# Patient Record
Sex: Female | Born: 1975 | Race: Black or African American | Hispanic: No | Smoking: Former smoker
Health system: Southern US, Community
[De-identification: ages and names within clinical notes are randomized; demographics above are authoritative.]

## PROBLEM LIST (undated history)

## (undated) DIAGNOSIS — E282 Polycystic ovarian syndrome: Secondary | ICD-10-CM

## (undated) DIAGNOSIS — F329 Major depressive disorder, single episode, unspecified: Secondary | ICD-10-CM

## (undated) DIAGNOSIS — I10 Essential (primary) hypertension: Secondary | ICD-10-CM

## (undated) DIAGNOSIS — F32A Depression, unspecified: Secondary | ICD-10-CM

## (undated) DIAGNOSIS — G43909 Migraine, unspecified, not intractable, without status migrainosus: Secondary | ICD-10-CM

## (undated) DIAGNOSIS — E559 Vitamin D deficiency, unspecified: Secondary | ICD-10-CM

## (undated) DIAGNOSIS — R7611 Nonspecific reaction to tuberculin skin test without active tuberculosis: Secondary | ICD-10-CM

## (undated) DIAGNOSIS — R011 Cardiac murmur, unspecified: Secondary | ICD-10-CM

## (undated) DIAGNOSIS — B019 Varicella without complication: Secondary | ICD-10-CM

## (undated) DIAGNOSIS — K5792 Diverticulitis of intestine, part unspecified, without perforation or abscess without bleeding: Secondary | ICD-10-CM

## (undated) HISTORY — DX: Varicella without complication: B01.9

## (undated) HISTORY — DX: Nonspecific reaction to tuberculin skin test without active tuberculosis: R76.11

## (undated) HISTORY — DX: Major depressive disorder, single episode, unspecified: F32.9

## (undated) HISTORY — DX: Vitamin D deficiency, unspecified: E55.9

## (undated) HISTORY — DX: Migraine, unspecified, not intractable, without status migrainosus: G43.909

## (undated) HISTORY — DX: Cardiac murmur, unspecified: R01.1

## (undated) HISTORY — PX: COLONOSCOPY: SHX174

## (undated) HISTORY — DX: Depression, unspecified: F32.A

## (undated) HISTORY — DX: Diverticulitis of intestine, part unspecified, without perforation or abscess without bleeding: K57.92

---

## 1997-12-09 ENCOUNTER — Emergency Department (HOSPITAL_COMMUNITY): Admission: EM | Admit: 1997-12-09 | Discharge: 1997-12-09 | Payer: Self-pay | Admitting: Emergency Medicine

## 1998-08-23 ENCOUNTER — Emergency Department (HOSPITAL_COMMUNITY): Admission: EM | Admit: 1998-08-23 | Discharge: 1998-08-24 | Payer: Self-pay

## 1998-08-24 ENCOUNTER — Ambulatory Visit (HOSPITAL_COMMUNITY): Admission: RE | Admit: 1998-08-24 | Discharge: 1998-08-24 | Payer: Self-pay | Admitting: Emergency Medicine

## 1998-08-24 ENCOUNTER — Encounter: Payer: Self-pay | Admitting: Emergency Medicine

## 2003-04-05 ENCOUNTER — Emergency Department (HOSPITAL_COMMUNITY): Admission: EM | Admit: 2003-04-05 | Discharge: 2003-04-05 | Payer: Self-pay | Admitting: Emergency Medicine

## 2007-06-01 ENCOUNTER — Emergency Department (HOSPITAL_COMMUNITY): Admission: EM | Admit: 2007-06-01 | Discharge: 2007-06-01 | Payer: Self-pay | Admitting: Family Medicine

## 2007-11-09 ENCOUNTER — Ambulatory Visit: Payer: Self-pay | Admitting: Internal Medicine

## 2007-11-19 ENCOUNTER — Ambulatory Visit: Payer: Self-pay | Admitting: Internal Medicine

## 2008-09-04 ENCOUNTER — Ambulatory Visit: Payer: Self-pay | Admitting: *Deleted

## 2008-09-04 ENCOUNTER — Encounter (INDEPENDENT_AMBULATORY_CARE_PROVIDER_SITE_OTHER): Payer: Self-pay | Admitting: Internal Medicine

## 2008-09-04 ENCOUNTER — Inpatient Hospital Stay (HOSPITAL_COMMUNITY): Admission: EM | Admit: 2008-09-04 | Discharge: 2008-09-06 | Payer: Self-pay | Admitting: Emergency Medicine

## 2010-05-16 LAB — COMPREHENSIVE METABOLIC PANEL
AST: 18 U/L (ref 0–37)
CO2: 27 mEq/L (ref 19–32)
Calcium: 8.6 mg/dL (ref 8.4–10.5)
Creatinine, Ser: 0.84 mg/dL (ref 0.4–1.2)
GFR calc Af Amer: >60 mL/min (ref >60)
GFR calc non Af Amer: >60 mL/min (ref >60)

## 2010-05-16 LAB — CBC
HCT: 36.2 % (ref 36.0–46.0)
Hemoglobin: 11.9 g/dL — ABNORMAL LOW (ref 12.0–15.0)
MCHC: 32.9 g/dL (ref 30.0–36.0)
MCHC: 33.3 g/dL (ref 30.0–36.0)
MCV: 74.3 fL — ABNORMAL LOW (ref 78.0–100.0)
MCV: 74.7 fL — ABNORMAL LOW (ref 78.0–100.0)
Platelets: 284 10*3/uL (ref 150–400)
Platelets: 324 10*3/uL (ref 150–400)
RBC: 4.85 MIL/uL (ref 3.87–5.11)
RDW: 17.3 % — ABNORMAL HIGH (ref 11.5–15.5)
RDW: 17.6 % — ABNORMAL HIGH (ref 11.5–15.5)
WBC: 13.1 10*3/uL — ABNORMAL HIGH (ref 4.0–10.5)

## 2010-05-16 LAB — URINALYSIS, ROUTINE W REFLEX MICROSCOPIC
Ketones, ur: NEGATIVE mg/dL
Nitrite: NEGATIVE
Protein, ur: NEGATIVE mg/dL

## 2010-05-16 LAB — DIFFERENTIAL
Basophils Relative: 0 % (ref 0–1)
Eosinophils Absolute: 0 10*3/uL (ref 0.0–0.7)
Lymphs Abs: 0.3 10*3/uL — ABNORMAL LOW (ref 0.7–4.0)
Neutro Abs: 12.4 10*3/uL — ABNORMAL HIGH (ref 1.7–7.7)
Neutrophils Relative %: 94 % — ABNORMAL HIGH (ref 43–77)

## 2010-05-16 LAB — BASIC METABOLIC PANEL
BUN: 7 mg/dL (ref 6–23)
CO2: 28 mEq/L (ref 19–32)
Calcium: 8.6 mg/dL (ref 8.4–10.5)
Chloride: 106 mEq/L (ref 96–112)
Creatinine, Ser: 0.85 mg/dL (ref 0.4–1.2)
GFR calc Af Amer: >60 mL/min (ref >60)
GFR calc non Af Amer: >60 mL/min (ref >60)
Glucose, Bld: 97 mg/dL (ref 70–99)
Potassium: 3.6 mEq/L (ref 3.5–5.1)
Sodium: 139 mEq/L (ref 135–145)

## 2010-05-16 LAB — CULTURE, BLOOD (ROUTINE X 2): Culture: NO GROWTH

## 2010-05-16 LAB — CK: Total CK: 66 U/L (ref 7–177)

## 2010-05-16 LAB — SEDIMENTATION RATE: Sed Rate: 5 mm/hr (ref 0–22)

## 2010-06-22 NOTE — Discharge Summary (Signed)
Chloe [name], [name]             ACCOUNT NO.:  [id number]   MEDICAL RECORD NO.:  [id number]          PATIENT TYPE:  INP   LOCATION:  1519                         FACILITY:  [facility]   PHYSICIAN:  Ladell Pier, M.D.   DATE OF BIRTH:  Dec 28, 1975   DATE OF ADMISSION:  09/03/2008  DATE OF DISCHARGE:  09/06/2008                               DISCHARGE SUMMARY   CHIEF COMPLAINT:  Swelling and pain in the right lower extremity.   DISCHARGE DIAGNOSES:  1. Cellulitis in the right groin and right lower extremity.  2. Tobacco use.  3. Obesity.  4. Polycystic ovary disease.   DISCHARGE MEDICATIONS:  Bactrim double strength two p.o. b.i.d. x10  days.   FOLLOWUP APPOINTMENTS:  The patient is to follow up with primary care  doctor, Dr. Mercy [name], in 1 week.   PROCEDURE:  None.   CONSULTATIONS:  None.   HISTORY OF PRESENT ILLNESS:  The patient is a 35 year old African  American female that presented to the emergency room with severe pain in  the right extremity.  She had no history of tick bite or bug bite or  superficial wound.  No blunt trauma or irritation to the area.  She  reports that the pain is in the inguinal area.  It shoots down her right  leg.  Please see the admission note for remainder of history.   Past medical history, family history, social history, meds, allergies  and review of systems per admission H and P.   PHYSICAL EXAMINATION ON DISCHARGE:  VITAL SIGNS:  Temperature 98.4,  pulse 69, respirations 22, blood pressure 133/84, pulse ox 100% on room  air.  GENERAL:  The patient is lying on the bed and does not seem to be in any  acute distress.  HEENT:  Head is normocephalic, atraumatic.  Pupils reactive to light.  Throat without erythema.  CARDIOVASCULAR:  Regular rate and rhythm.  LUNGS:  Clear bilaterally.  ABDOMEN:  Positive bowel sounds.  EXTREMITIES:  Her right lower extremity has improved erythema with about  1+ pitting edema and 2+ DP pulse.   HOSPITAL  COURSE:  Right lower extremity swelling, pain, tenderness and  redness from the groin area going all the way down to the foot.  The  patient was admitted to the hospital.  It was presumed that it could be  possibly either infection or deep venous thrombosis.  She was started on  Lovenox and IV antibiotics, Rocephin and vancomycin.  She had Dopplers  done of her lower extremity that were negative for deep venous  thrombosis.  She was continued on the vancomycin and Rocephin and her  legs were improved significantly without any pain in the area.  She will  be discharged on Bactrim double strength to take 2 twice a day for 10  days.  She will follow up with her primary care doctor within a week.   DISCHARGE LABS:  Blood culture negative x2.  Sodium 138, potassium 3.5,  chloride 105, CO2 27, glucose 84, BUN 6, creatinine 0.84 AST 18, ALT 29,  WBC 15.1, hemoglobin 10.6, platelets 286, CK of 66,  ESR of 5.   CT scan showed a right groin cellulitis with associated inguinal  adenitis, nonspecific enlargement of both ovaries and this could be due  to polycystic ovary disease.      Ladell Pier, M.D.  Electronically Signed     NJ/MEDQ  D:  09/06/2008  T:  09/06/2008  Job:  [id number]   cc:   Dr Chloe [name]

## 2010-06-22 NOTE — H&P (Signed)
[name], [name]             ACCOUNT NO.:  [id number]   MEDICAL RECORD NO.:  [id number]          PATIENT TYPE:  EMS   LOCATION:  ED                           FACILITY:  [facility]   PHYSICIAN:  Lonia Blood, M.D.DATE OF BIRTH:  October 16, 1975   DATE OF ADMISSION:  09/03/2008  DATE OF DISCHARGE:                              HISTORY & PHYSICAL   PRIMARY CARE PHYSICIAN:  Gentry Fitz out of [place] area.   CHIEF COMPLAINT:  Severe pain in right groin and right lower extremity  with swelling.   HISTORY OF PRESENT ILLNESS:  [name] is an otherwise healthy  35 year old female who suffered the acute onset of severe right lower  extremity pain yesterday evening.  She reports absolutely no history of  bug bite, superficial wound, blunt force trauma or other irritations to  this area.  She has no previous history of similar symptoms.  She  reports that the pain is in the right inguinal crease.  It is very  severe and shoots down the right leg.  It is much worse with movement.  With this, the patient has had some nausea, and she has had a subjective  fever.  There has been no vomiting, no chest pain, and no shortness of  breath whatsoever.  The patient denies recent distant travel.  She  denies history of blood clots or clotting disorders in the family.  She  does admit to bilateral intermittent lower extremity swelling but states  that it has been much worse in the right lower extremity compared to the  left for the past week or so.   REVIEW OF SYSTEMS:  The patient reports that she has irregular periods  and that she was informed that this is related to her polycystic ovarian  disease.  Otherwise, a comprehensive review of systems is unremarkable  with exception to the positive elements noted in the history of present  illness above.   PAST MEDICAL HISTORY:  1. Tobacco abuse in the amount of 1 pack per week.  2. Polycystic ovarian disease.  3. Obesity.   OUTPATIENT  MEDICATIONS:  None.   ALLERGIES:  NO KNOWN DRUG ALLERGIES.   FAMILY HISTORY:  Noncontributory.   SOCIAL HISTORY:  The patient occasionally partakes of alcohol.  She is  single.  She works at [employer] in [place].   DATA REVIEWED:  Hemoglobin is low at 11.9 with an MCV that is low at 75.  White count is elevated at 13.1.  Platelet count is normal.  Sodium,  potassium, chloride, bicarb, BUN and creatinine are normal.  Serum  glucose is normal.  Calcium is normal.  Sed rate is normal.  Urine  pregnancy test is negative.  Urinalysis is unrevealing.  Right hip x-ray  reveals no acute disease.  CT scan of the pelvis reveals right groin  cellulitis, reveals findings suggestive of a right groin cellulitis with  associated inguinal adenitis but no evidence of an abscess.  They also  comment on nonspecific enlargement of bilateral ovaries consistent with  PCO.   PHYSICAL EXAMINATION:  VITAL SIGNS:  Temperature 100.8, blood pressure  139/67, heart rate 129, respiratory rate 22, O2 saturation 96% on room  air.  GENERAL:  Obese female in no acute respiratory distress.  HEENT:  Normocephalic, atraumatic.  Pupils equal, round, react to light  and accommodation.  Extraocular muscles intact bilaterally.  OC/OP  clear.  NECK:  No JVD.  LUNGS:  Clear to auscultation bilaterally without wheezes or rhonchi.  CARDIOVASCULAR:  Tachycardic but regular without appreciable gallop or  rub and no appreciable murmur.  ABDOMEN:  Obese, soft, bowel sounds present.  No organomegaly, no  rebound, no ascites.  EXTREMITIES:  There is 1+ right lower extremity edema with no  significant left lower extremity edema.  CUTANEOUS:  The area of the right inguinal crease is slightly indurated.  There is no significant erythema.  The area is remarkably warm to touch.  It is also severely painful with palpation.  There is no evidence of  overlying cutaneous lesions, abrasions, papules macules or vesicles.   NEUROLOGIC:  Nonfocal neurologic exam.   IMPRESSION AND PLAN:  1. Right thigh/groin pain with induration and lower extremity      swelling:  The two primary etiologies on my differential include      cellulitis or possible deep venous thrombosis.  This patient's      obesity would predispose her for the possibility of deep venous      thrombosis.  Given her history of intermittent swelling and its      propensity to be worse on the right, I am even further concerned      about this possibility.  No evaluations for this have been carried      out in the emergency room.  Given my pretest probability, I feel      that it is in the patient's best interest to fully anticoagulate      her for now.  I am considering obtaining a D-dimer; however, given      the subacute nature of the patient's swelling, I am not sure that a      negative result would be entirely reliable for what could represent      more of a chronic on acute deep venous thrombosis.  As a result, I      will not obtain this test, and I will simply resort to lower      extremity Dopplers.  The patient will also be treated with Rocephin      for routine community-acquired cellulitis.  Given her young age, it      is anticipated that she will likely be stable for discharge      tomorrow.  2. Tobacco abuse:  I have counseled the patient extensively as to the      multiple deleterious effects of tobacco abuse.  She reports that      she has been cutting back.  I have advised her that this is quite      wise and encouraged her to discontinue smoking completely.  3. Obesity:  I have counseled the patient as to the multiple      deleterious effects of continued obesity.  I have counseled her on      the need for weight loss.  She states that she fully understands      and has, in fact, modified her lifestyle such that she has been      losing weight consistently as of late.      Lonia Blood, M.D.  Electronically  Signed  JTM/MEDQ  D:  09/03/2008  T:  09/03/2008  Job:  [id number]

## 2010-11-02 LAB — POCT URINALYSIS DIP (DEVICE)
Glucose, UA: NEGATIVE
Nitrite: NEGATIVE
Operator id: 116391
Specific Gravity, Urine: >=1.03
Urobilinogen, UA: 0.2

## 2011-02-10 IMAGING — CT CT PELVIS W/ CM
1 series · 15 of 32 positions shown, 19 images · IV contrast (agent unspecified)
Comparison: Pelvic radiographs done today.

CLINICAL DATA: 33-year-old with right groin pain and fever.
Question abscess.

CT PELVIS WITH CONTRAST
TECHNIQUE: Multidetector CT imaging of the pelvis was performed
using the standard protocol following the bolus administration of
intravenous contrast.
Contrast: 100 ml 3mnipaque-PSS intravenously.

[Series 2: rtn pelvis st · axial · 0.80mm/px · z∈[-451,-141]mm · 15 of 70 slices shown, 19 images]
[im 5/70  soft-tissue]
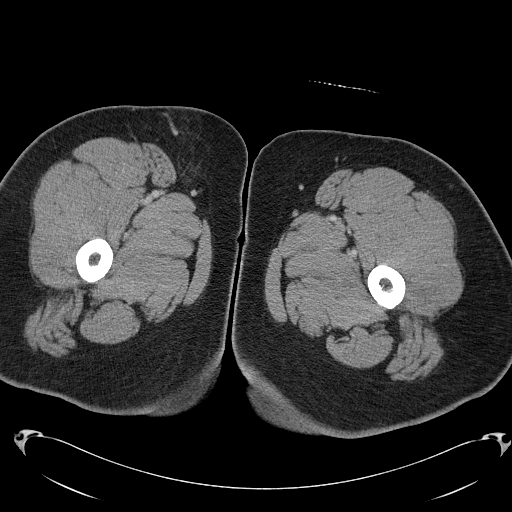
[im 5/70  bone]
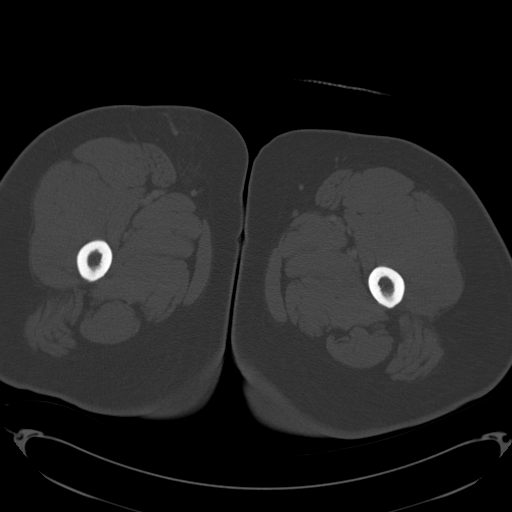
[im 9/70  soft-tissue]
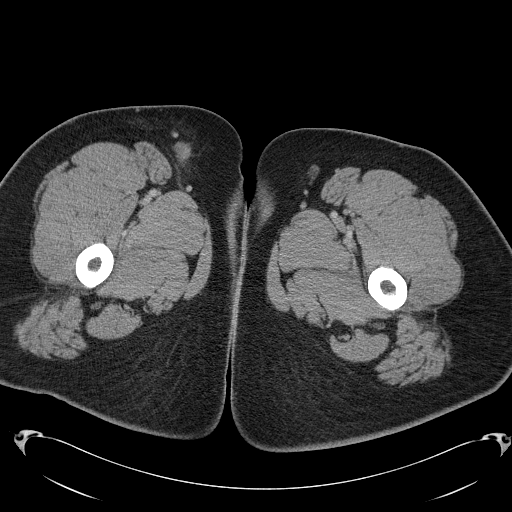
[im 14/70  soft-tissue]
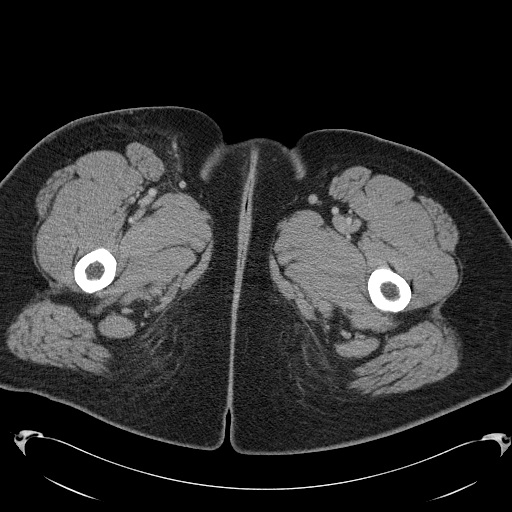
[im 21/70  soft-tissue]
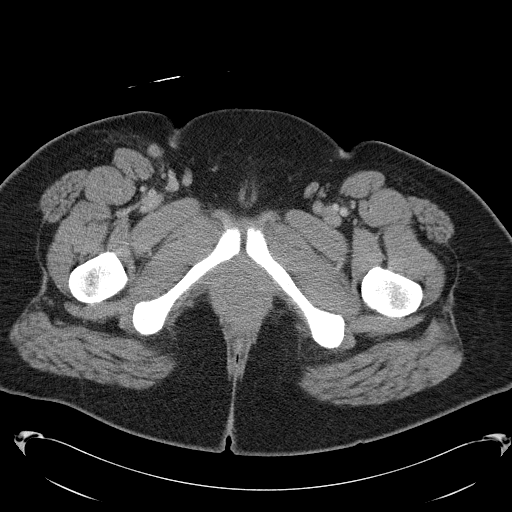
[im 25/70  soft-tissue]
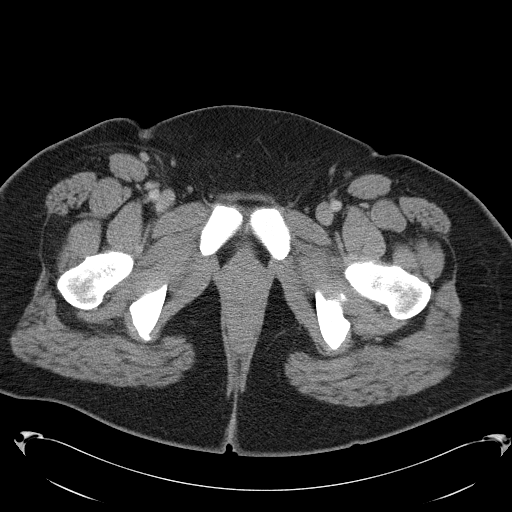
[im 29/70  soft-tissue]
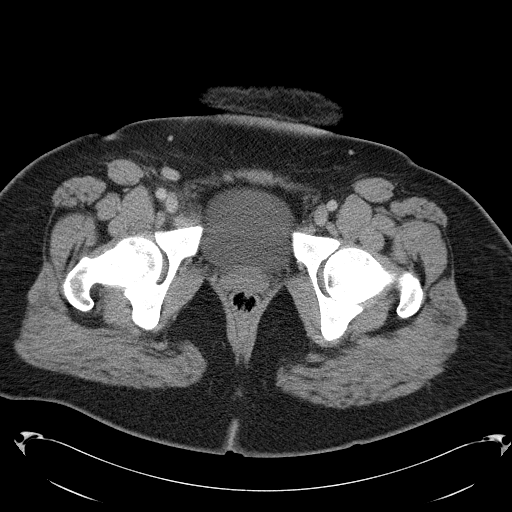
[im 36/70  soft-tissue]
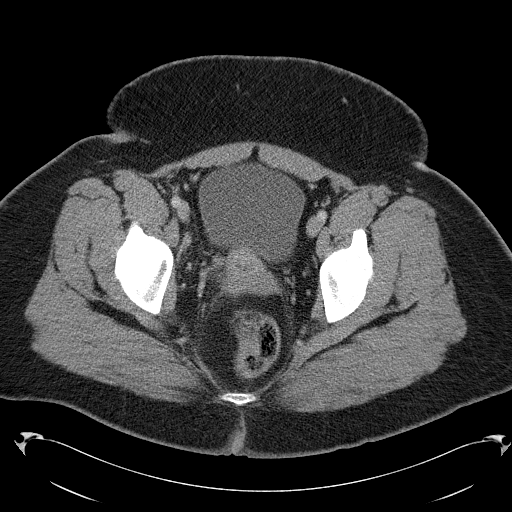
[im 41/70  soft-tissue]
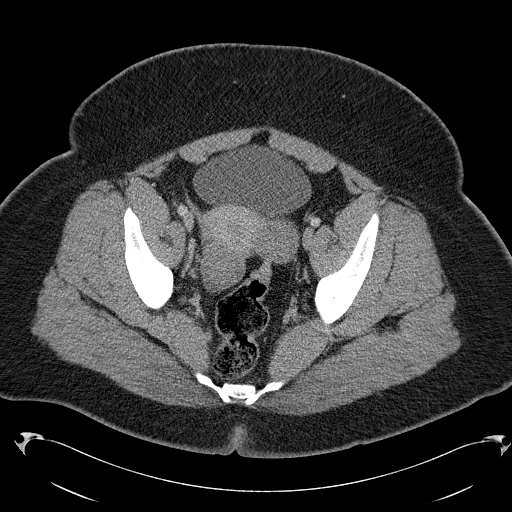
[im 45/70  soft-tissue]
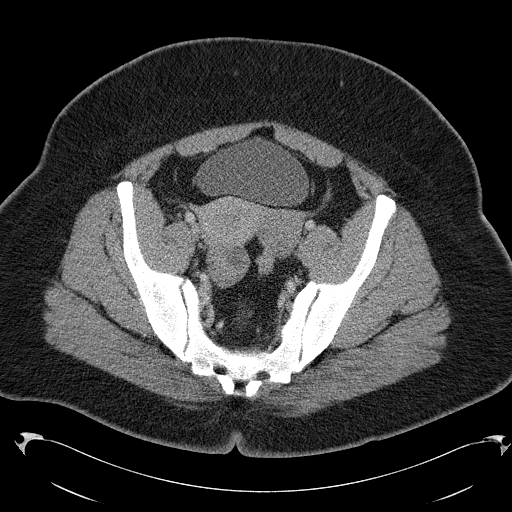
[im 45/70  bone]
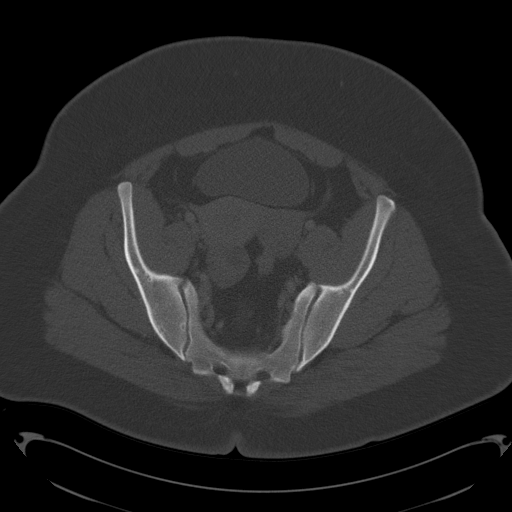
[im 49/70  soft-tissue]
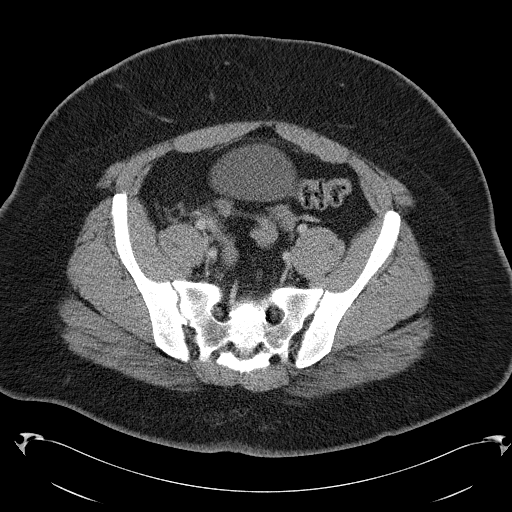
[im 56/70  soft-tissue]
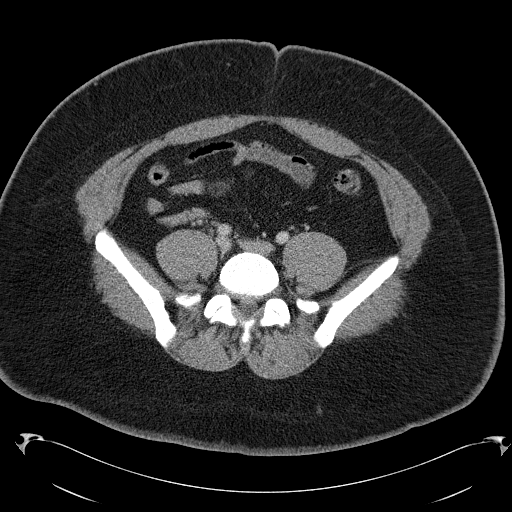
[im 61/70  soft-tissue]
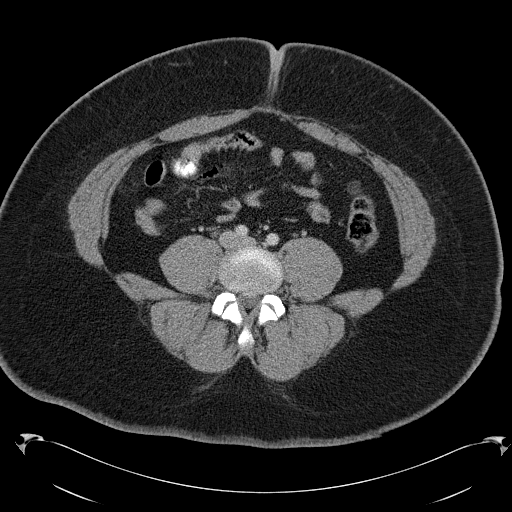
[im 61/70  lung]
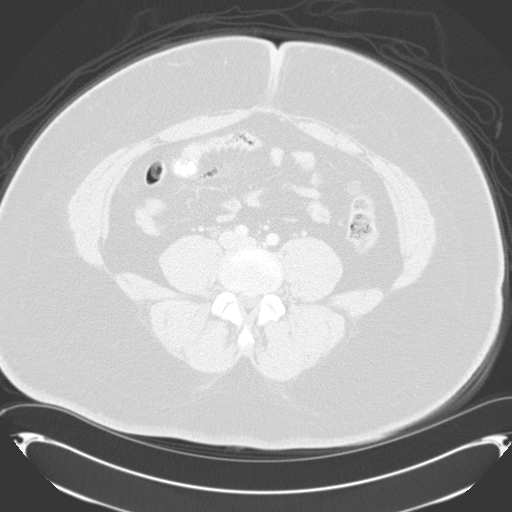
[im 63/70  lung]
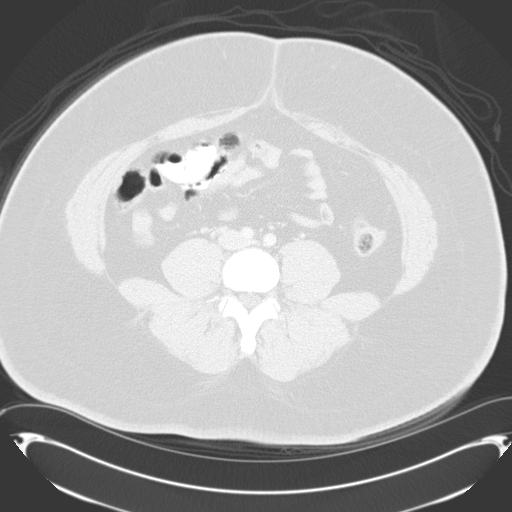
[im 65/70  soft-tissue]
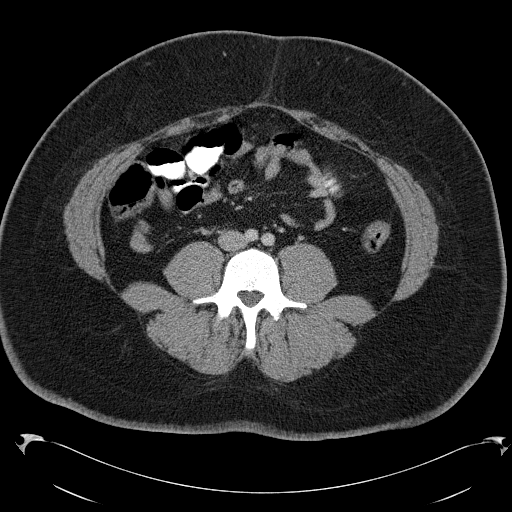
[im 65/70  lung]
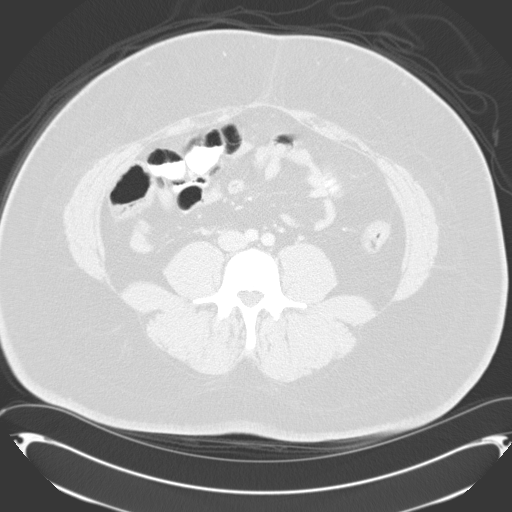
[im 67/70  lung]
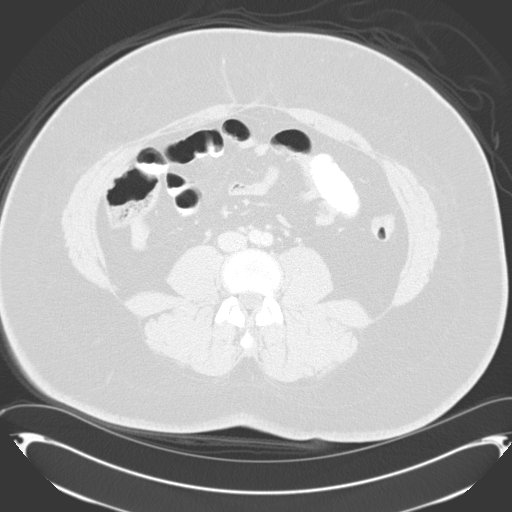

[15 of 32 positions shown; findings below may reference images not displayed]

FINDINGS: There is soft tissue stranding in the fat of the right
groin associated with asymmetrically enlarged right inguinal lymph
nodes.  The largest node measures 1.9 x 1.8 cm transverse on image
60.  Within the pelvis, there are mildly prominent external iliac
nodes, not pathologically enlarged.  There is mild soft tissue
stranding anterior to the right psoas muscle on image 24.  No focal
fluid collection is identified.

Both ovaries are prominent in size.  The right ovary measures 3.6 x
3.7 x 6.7 cm.  The left ovary measures 4.3 x 3.5 x 4.7 cm.  The
uterus appears normal.

The cecal tip and terminal ileum appear normal.  The appendix is
not clearly visualized on this examination which does not include
the abdomen.  The pelvic bowel gas pattern is normal.  There is no
evidence of hernia.  No osseous abnormalities are seen.
IMPRESSION: 1.  Findings suggest right groin cellulitis and associated inguinal
adenitis.  Correlate clinically.
2.  No evidence of soft tissue abscess.
3.  Nonspecific enlargement of both ovaries.  There does not appear
to be significant inflammatory change in the pelvis, and this could
be due to polycystic ovarian disease.  Clinical correlation and
pelvic ultrasound are suggested.

## 2012-01-25 ENCOUNTER — Emergency Department (HOSPITAL_COMMUNITY)
Admission: EM | Admit: 2012-01-25 | Discharge: 2012-01-25 | Disposition: A | Discharge status: Home / Self Care | Payer: BC Managed Care – PPO | Attending: Emergency Medicine | Admitting: Emergency Medicine

## 2012-01-25 ENCOUNTER — Encounter (HOSPITAL_COMMUNITY): Payer: Self-pay | Admitting: *Deleted

## 2012-01-25 DIAGNOSIS — Z87891 Personal history of nicotine dependence: Secondary | ICD-10-CM | POA: Insufficient documentation

## 2012-01-25 DIAGNOSIS — I10 Essential (primary) hypertension: Secondary | ICD-10-CM | POA: Insufficient documentation

## 2012-01-25 DIAGNOSIS — R350 Frequency of micturition: Secondary | ICD-10-CM | POA: Insufficient documentation

## 2012-01-25 DIAGNOSIS — I160 Hypertensive urgency: Secondary | ICD-10-CM

## 2012-01-25 DIAGNOSIS — Z8742 Personal history of other diseases of the female genital tract: Secondary | ICD-10-CM | POA: Insufficient documentation

## 2012-01-25 DIAGNOSIS — R51 Headache: Secondary | ICD-10-CM | POA: Insufficient documentation

## 2012-01-25 HISTORY — DX: Polycystic ovarian syndrome: E28.2

## 2012-01-25 HISTORY — DX: Essential (primary) hypertension: I10

## 2012-01-25 LAB — CBC WITH DIFFERENTIAL/PLATELET
Lymphocytes Relative: 48 % — ABNORMAL HIGH (ref 12–46)
Lymphs Abs: 1.8 10*3/uL (ref 0.7–4.0)
MCV: 75 fL — ABNORMAL LOW (ref 78.0–100.0)
Neutro Abs: 1.5 10*3/uL — ABNORMAL LOW (ref 1.7–7.7)
Neutrophils Relative %: 39 % — ABNORMAL LOW (ref 43–77)
Platelets: 318 10*3/uL (ref 150–400)
RBC: 5.24 MIL/uL — ABNORMAL HIGH (ref 3.87–5.11)
WBC: 3.7 10*3/uL — ABNORMAL LOW (ref 4.0–10.5)

## 2012-01-25 LAB — BASIC METABOLIC PANEL
CO2: 29 mEq/L (ref 19–32)
Chloride: 102 mEq/L (ref 96–112)
Glucose, Bld: 85 mg/dL (ref 70–99)
Potassium: 3.7 mEq/L (ref 3.5–5.1)
Sodium: 140 mEq/L (ref 135–145)

## 2012-01-25 MED ORDER — HYDROCHLOROTHIAZIDE 25 MG PO TABS: 25.0000 mg | ORAL_TABLET | Freq: Every day | ORAL | Status: DC

## 2012-01-25 NOTE — ED Notes (Signed)
Pt reports HTN.  Hx of, denies taking medication in 2 years.  Denies HA.  States that she has had trouble focusing at work when looking at a computer screen, states that her glasses are broken.

## 2012-01-25 NOTE — ED Notes (Signed)
MD at bedside. 

## 2012-01-25 NOTE — ED Notes (Signed)
Pt denies any complaints at this time.  Denies any blurry vision.

## 2012-01-25 NOTE — ED Provider Notes (Signed)
History    This chart was scribed for Geoffery Lyons, MD, MD by Smitty Pluck, ED Scribe. The patient was seen in room TR08C and the patient's care was started at 12:16PM.   CSN: [id number]  Arrival date & time 01/25/12  1135     Chief Complaint  Patient presents with  . Hypertension    (Consider location/radiation/quality/duration/timing/severity/associated sxs/prior treatment) The history is provided by the patient. No language interpreter was used.   Chloe [name] is a 36 y.o. female with h/o HTN and PCOS who presents to the Emergency Department complaining of elevated blood pressure onset today. Pt reports that she took herself off of HTN medication (hydrochlorothiazide) 2 years ago because she lost weight and made lifestyle changes. She states that she has been under stress at work and has gained weight. Pt reports having headaches over the past week and urinary frequency. Pt reports BP 180/120 today while she was in dental office and they told her that they were unable to treat her. Her BP in ED is 183/99. Pt reports that she used to take metformin for PCOS but did not DM. She reports that she had her BP checked a couple of months ago and it was in normal range. She denies any nausea, vomiting and any other pain.   Past Medical History  Diagnosis Date  . Hypertension   . PCOS (polycystic ovarian syndrome)     History reviewed. No pertinent past surgical history.  History reviewed. No pertinent family history.  History  Substance Use Topics  . Smoking status: Former Smoker    Types: Cigarettes    Quit date: 08/25/2011  . Smokeless tobacco: Not on file  . Alcohol Use: No    OB History    Grav Para Term Preterm Abortions TAB SAB Ect Mult Living                  Review of Systems  Constitutional: Negative for fever and chills.  Respiratory: Negative for shortness of breath.   Gastrointestinal: Negative for nausea and vomiting.  Genitourinary: Positive for  frequency.  Neurological: Positive for headaches. Negative for weakness.  All other systems reviewed and are negative.    Allergies  Review of patient's allergies indicates no known allergies.  Home Medications  No current outpatient prescriptions on file.  BP 183/99  Pulse 74  Temp 98.8 F (37.1 C)  Resp 18  SpO2 100%  Physical Exam  Nursing note and vitals reviewed. Constitutional: She is oriented to person, place, and time. She appears well-developed and well-nourished. No distress.  HENT:  Head: Normocephalic and atraumatic.  Eyes: EOM are normal. Pupils are equal, round, and reactive to light.  Neck: Normal range of motion. Neck supple. No tracheal deviation present.  Cardiovascular: Normal rate, regular rhythm and normal heart sounds.   Pulmonary/Chest: Effort normal and breath sounds normal. No respiratory distress.  Musculoskeletal: Normal range of motion.  Neurological: She is alert and oriented to person, place, and time.  Skin: Skin is warm and dry.  Psychiatric: She has a normal mood and affect. Her behavior is normal.    ED Course  Procedures (including critical care time) DIAGNOSTIC STUDIES: Oxygen Saturation is 100% on room air, normal by my interpretation.    COORDINATION OF CARE: 12:21 PM Discussed ED treatment with pt   Results for orders placed during the hospital encounter of 01/25/12  CBC WITH DIFFERENTIAL      Component Value Range   WBC 3.7 (*) 4.0 -  10.5 K/uL   RBC 5.24 (*) 3.87 - 5.11 MIL/uL   Hemoglobin 13.4  12.0 - 15.0 g/dL   HCT 14.7  82.9 - 56.2 %   MCV 75.0 (*) 78.0 - 100.0 fL   MCH 25.6 (*) 26.0 - 34.0 pg   MCHC 34.1  30.0 - 36.0 g/dL   RDW 13.0  86.5 - 78.4 %   Platelets 318  150 - 400 K/uL   Neutrophils Relative 39 (*) 43 - 77 %   Neutro Abs 1.5 (*) 1.7 - 7.7 K/uL   Lymphocytes Relative 48 (*) 12 - 46 %   Lymphs Abs 1.8  0.7 - 4.0 K/uL   Monocytes Relative 9  3 - 12 %   Monocytes Absolute 0.3  0.1 - 1.0 K/uL   Eosinophils  Relative 3  0 - 5 %   Eosinophils Absolute 0.1  0.0 - 0.7 K/uL   Basophils Relative 1  0 - 1 %   Basophils Absolute 0.0  0.0 - 0.1 K/uL        No results found.   No diagnosis found.   Date: 01/25/2012  Rate: 67  Rhythm: normal sinus rhythm  QRS Axis: normal  Intervals: normal  ST/T Wave abnormalities: normal  Conduction Disutrbances:none  Narrative Interpretation:   Old EKG Reviewed: unchanged    MDM  The patient presents with elevated bp at the dentist's office.  She reports she has been off her hctz for several months.  The labs and ekg do not show any evidence for end organ damage.  Her blood pressure has improved significantly without intervention.  She will be discharged to home with hctz, follow up with pcp.   I personally performed the services described in this documentation, which was scribed in my presence. The recorded information has been reviewed and is accurate.         Geoffery Lyons, MD 01/25/12 1430

## 2012-01-25 NOTE — ED Notes (Signed)
Blood draw attempted, unable to obtain sample. Phlebotomy notified.

## 2012-09-25 ENCOUNTER — Encounter: Payer: Self-pay | Admitting: Internal Medicine

## 2013-04-26 ENCOUNTER — Encounter: Payer: Self-pay | Admitting: Internal Medicine

## 2014-05-08 ENCOUNTER — Encounter: Payer: Self-pay | Admitting: Family

## 2014-05-08 ENCOUNTER — Ambulatory Visit (INDEPENDENT_AMBULATORY_CARE_PROVIDER_SITE_OTHER): Payer: No Typology Code available for payment source | Admitting: Family

## 2014-05-08 ENCOUNTER — Telehealth: Payer: Self-pay | Admitting: Family

## 2014-05-08 VITALS — BP 184/140 | HR 89 | Temp 98.1°F | Resp 18 | Ht 68.0 in | Wt 260.0 lb

## 2014-05-08 DIAGNOSIS — K5730 Diverticulosis of large intestine without perforation or abscess without bleeding: Secondary | ICD-10-CM | POA: Diagnosis not present

## 2014-05-08 DIAGNOSIS — K5790 Diverticulosis of intestine, part unspecified, without perforation or abscess without bleeding: Secondary | ICD-10-CM | POA: Insufficient documentation

## 2014-05-08 DIAGNOSIS — E282 Polycystic ovarian syndrome: Secondary | ICD-10-CM | POA: Insufficient documentation

## 2014-05-08 DIAGNOSIS — I10 Essential (primary) hypertension: Secondary | ICD-10-CM | POA: Insufficient documentation

## 2014-05-08 MED ORDER — HYDROCHLOROTHIAZIDE 25 MG PO TABS: 25.0000 mg | ORAL_TABLET | Freq: Every day | ORAL | Status: DC

## 2014-05-08 MED ORDER — METFORMIN HCL 1000 MG PO TABS: 1000.0000 mg | ORAL_TABLET | Freq: Two times a day (BID) | ORAL | Status: DC

## 2014-05-08 NOTE — Progress Notes (Signed)
Pre visit review using our clinic review tool, if applicable. No additional management support is needed unless otherwise documented below in the visit note. 

## 2014-05-08 NOTE — Patient Instructions (Signed)
Thank you for choosing [facility].  Summary/Instructions:  Your prescription(s) have been submitted to your pharmacy or been printed and provided for you. Please take as directed and contact our office if you believe you are having problem(s) with the medication(s) or have any questions.  If your symptoms worsen or fail to improve, please contact our office for further instruction, or in case of emergency go directly to the emergency room at the closest medical facility.     

## 2014-05-08 NOTE — Progress Notes (Signed)
Subjective:    Patient ID: Chloe [name], female    DOB: 05/20/1975, 39 y.o.   MRN: [id number]   Chief Complaint  Patient presents with  . Establish Care    Needs referral to Dr. Dickie LaBrody for colonoscopy and needs something for her PCOS, and BP medication refilled    HPI:  Chloe [name] is a 39 y.o. female who presents today to establish care and discuss referrals and hypertension.  1) Blood pressure - Previously diagnosed with hypertension. Was taking hydrochlorothiazide and has been out of medication for about 7 months. Reports no adverse effects of the medication, simply ran out of it.  BP Readings from Last 3 Encounters:  05/08/14 184/140  01/25/12 153/92    2) Colonoscopy - Sister was diagnosed with colorectal cancer at age 38 and she had her last colonoscopy about 5 years ago which showed diverticulosis. She is coming due for her next colonoscopy screening.   3) PCOS - Diagnosed at the age of 39. Associated symptoms of hirtusim and amenorrhea. Currently not being followed by any GYN. Is interested in restarting the metformin.   Allergies  Allergen Reactions  . Tape     Paper tape "swollen and red"    Current Outpatient Prescriptions on File Prior to Visit  Medication Sig Dispense Refill  . hydrochlorothiazide (HYDRODIURIL) 25 MG tablet Take 1 tablet (25 mg total) by mouth daily. 30 tablet 1   No current facility-administered medications on file prior to visit.    Past Medical History  Diagnosis Date  . Hypertension   . PCOS (polycystic ovarian syndrome)   . Chicken pox   . Depression   . Migraine   . Heart murmur   . Positive TB test   . Diverticulitis     History reviewed. No pertinent past surgical history.  Family History  Problem Relation Age of Onset  . Hyperlipidemia Mother   . Hypertension Mother   . Drug abuse Father   . Hyperlipidemia Father   . Stroke Father   . Hypertension Father   . Kidney disease Father   . Diabetes Father   .  Hyperlipidemia Sister   . Hypertension Sister     History   Social History  . Marital Status: Divorced    Spouse Name: N/A  . Number of Children: 0  . Years of Education: 14   Occupational History  . Eligibility and Enrollment Specialist    Social History Main Topics  . Smoking status: Former Smoker    Types: Cigarettes    Quit date: 08/25/2011  . Smokeless tobacco: Never Used  . Alcohol Use: Yes     Comment: occasionally  . Drug Use: No  . Sexual Activity: Not on file   Other Topics Concern  . Not on file   Social History Narrative   Fun: Outside activities, family activity, laser tag   Denies any religious beliefs effecting health care.           Review of Systems  Constitutional: Negative for fever and chills.  Respiratory: Negative for chest tightness and shortness of breath.   Cardiovascular: Negative for chest pain, palpitations and leg swelling.  Genitourinary: Positive for menstrual problem.  Neurological: Positive for headaches.      Objective:    BP 184/140 mmHg  Pulse 89  Temp(Src) 98.1 F (36.7 C) (Oral)  Resp 18  Ht 5\' 8"  (1.727 m)  Wt 260 lb (117.935 kg)  BMI 39.54 kg/m2  SpO2 96% Nursing  note and vital signs reviewed.  Physical Exam  Constitutional: She is oriented to person, place, and time. She appears well-developed and well-nourished. No distress.  Obese female seated in the chair, appears her stated age and is dressed appropriately for the situation.   Cardiovascular: Normal rate, regular rhythm, normal heart sounds and intact distal pulses.   Pulmonary/Chest: Effort normal and breath sounds normal.  Neurological: She is alert and oriented to person, place, and time.  Skin: Skin is warm and dry.  Psychiatric: She has a normal mood and affect. Her behavior is normal. Judgment and thought content normal.       Assessment & Plan:

## 2014-05-08 NOTE — Assessment & Plan Note (Signed)
Patient presents with PCO S indicating she has lost approximately 16 pounds recently. Her menstrual cycles have returned from amenorrhea. She requests to restart metformin. Start metformin. Follow-up as needed.

## 2014-05-08 NOTE — Assessment & Plan Note (Signed)
Blood pressure elevated today secondary to access to previously prescribed hydrochlorothiazide. Restart hydrochlorothiazide. Follow-up in one month.

## 2014-05-08 NOTE — Telephone Encounter (Signed)
emmi emailed °

## 2014-05-08 NOTE — Assessment & Plan Note (Signed)
Previously diagnosed with diverticulosis via colonoscopy. Family history is significant for colorectal cancer. She is due for her next colonoscopy. Refer to gastroenterology for colonoscopy.

## 2014-05-09 ENCOUNTER — Encounter: Payer: Self-pay | Admitting: Internal Medicine

## 2014-07-02 ENCOUNTER — Ambulatory Visit (AMBULATORY_SURGERY_CENTER): Payer: Self-pay

## 2014-07-02 VITALS — Ht 67.5 in | Wt 264.2 lb

## 2014-07-02 DIAGNOSIS — Z800 Family history of malignant neoplasm of digestive organs: Secondary | ICD-10-CM

## 2014-07-02 MED ORDER — MOVIPREP 100 G PO SOLR: 1.0000 | Freq: Once | ORAL | Status: DC

## 2014-07-02 NOTE — Progress Notes (Signed)
No allergies to eggs or soy No diet/weight loss meds No home oxygen No past problems with anesthesia  Has email  Emmi instructions given for colonoscopy 

## 2014-07-16 ENCOUNTER — Ambulatory Visit (AMBULATORY_SURGERY_CENTER): Payer: BLUE CROSS/BLUE SHIELD | Admitting: Internal Medicine

## 2014-07-16 ENCOUNTER — Encounter: Payer: Self-pay | Admitting: Internal Medicine

## 2014-07-16 VITALS — BP 137/90 | HR 78 | Temp 98.3°F | Resp 16 | Ht 67.5 in | Wt 264.0 lb

## 2014-07-16 DIAGNOSIS — Z800 Family history of malignant neoplasm of digestive organs: Secondary | ICD-10-CM

## 2014-07-16 DIAGNOSIS — Z1211 Encounter for screening for malignant neoplasm of colon: Secondary | ICD-10-CM

## 2014-07-16 LAB — GLUCOSE, CAPILLARY
GLUCOSE-CAPILLARY: 73 mg/dL (ref 65–99)
Glucose-Capillary: 91 mg/dL (ref 65–99)

## 2014-07-16 MED ORDER — SODIUM CHLORIDE 0.9 % IV SOLN: 500.0000 mL | INTRAVENOUS | Status: DC

## 2014-07-16 NOTE — Op Note (Signed)
[facility] [address] [place], [id number]   COLONOSCOPY PROCEDURE REPORT  PATIENT: Chloe FlakeBurgess, [name]  MR#: [id number] BIRTHDATE: [birth year] , 38  yrs. old GENDER: female ENDOSCOPIST: Hart Carwinora M Chrishelle Zito, MD REFERRED BY: Marcos EkeGreg Calone FNP PROCEDURE DATE:  07/16/2014 PROCEDURE:   Colonoscopy, screening First Screening Colonoscopy - Avg.  risk and is 50 yrs.  old or older - No.  Prior Negative Screening - Now for repeat screening. Less than 10 yrs Prior Negative Screening - Now for repeat screening.  Above average risk  History of Adenoma - Now for follow-up colonoscopy & has been > or = to 3 yrs.  N/A  Polyps removed today? No Recommend repeat exam, <10 yrs? Yes high risk ASA CLASS:   Class II INDICATIONS:Screening for colonic neoplasia, FH Colon or Rectal Adenocarcinoma, and history with colon cancer at age 39.  Last colonoscopy October 2009 showed mild diverticulosis. MEDICATIONS: Propofol 320 mg IV  DESCRIPTION OF PROCEDURE:   After the risks benefits and alternatives of the procedure were thoroughly explained, informed consent was obtained.  The digital rectal exam revealed no abnormalities of the rectum.   The LB PFC-H190 [id number]  endoscope was introduced through the anus and advanced to the cecum, which was identified by both the appendix and ileocecal valve. No adverse events experienced.   The quality of the prep was excellent. (MoviPrep was used)  The instrument was then slowly withdrawn as the colon was fully examined. Estimated blood loss is zero unless otherwise noted in this procedure report.      COLON FINDINGS: There was mild diverticulosis noted throughout the entire examined colon.  Retroflexed views revealed no abnormalities. The time to cecum = 5.30 Withdrawal time = 6.40 The scope was withdrawn and the procedure completed. COMPLICATIONS: There were no immediate complications.  ENDOSCOPIC IMPRESSION: Mild diverticulosis was noted throughout  the entire examined colon  RECOMMENDATIONS: Continue high fiber diet Recall colonoscopy in 5 years  eSigned:  Hart Carwinora M Press Casale, MD 07/16/2014 8:01 AM   cc:

## 2014-07-16 NOTE — Progress Notes (Signed)
No problems moving neck

## 2014-07-16 NOTE — Patient Instructions (Addendum)
YOU HAD AN ENDOSCOPIC PROCEDURE TODAY AT THE [facility]:   Refer to the procedure report that was given to you for any specific questions about what was found during the examination.  If the procedure report does not answer your questions, please call your gastroenterologist to clarify.  If you requested that your care partner not be given the details of your procedure findings, then the procedure report has been included in a sealed envelope for you to review at your convenience later.  YOU SHOULD EXPECT: Some feelings of bloating in the abdomen. Passage of more gas than usual.  Walking can help get rid of the air that was put into your GI tract during the procedure and reduce the bloating. If you had a lower endoscopy (such as a colonoscopy or flexible sigmoidoscopy) you may notice spotting of blood in your stool or on the toilet paper. If you underwent a bowel prep for your procedure, you may not have a normal bowel movement for a few days.  Please Note:  You might notice some irritation and congestion in your nose or some drainage.  This is from the oxygen used during your procedure.  There is no need for concern and it should clear up in a day or so.  SYMPTOMS TO REPORT IMMEDIATELY:   Following lower endoscopy (colonoscopy or flexible sigmoidoscopy):  Excessive amounts of blood in the stool  Significant tenderness or worsening of abdominal pains  Swelling of the abdomen that is new, acute  Fever of 100F or higher  For urgent or emergent issues, a gastroenterologist can be reached at any hour by calling [phone].   DIET: Your first meal following the procedure should be a small meal and then it is ok to progress to your normal diet. Heavy or fried foods are harder to digest and may make you feel nauseous or bloated.  Likewise, meals heavy in dairy and vegetables can increase bloating.  Drink plenty of fluids but you should avoid alcoholic beverages for 24  hours.  ACTIVITY:  You should plan to take it easy for the rest of today and you should NOT DRIVE or use heavy machinery until tomorrow (because of the sedation medicines used during the test).    FOLLOW UP: Our staff will call the number listed on your records the next business day following your procedure to check on you and address any questions or concerns that you may have regarding the information given to you following your procedure. If we do not reach you, we will leave a message.  However, if you are feeling well and you are not experiencing any problems, there is no need to return our call.  We will assume that you have returned to your regular daily activities without incident.  If any biopsies were taken you will be contacted by phone or by letter within the next 1-3 weeks.  Please call us at [phone] if you have not heard about the biopsies in 3 weeks.    SIGNATURES/CONFIDENTIALITY: You and/or your care partner have signed paperwork which will be entered into your electronic medical record.  These signatures attest to the fact that that the information above on your After Visit Summary has been reviewed and is understood.  Full responsibility of the confidentiality of this discharge information lies with you and/or your care-partner.  Next colonoscopy in 5 years. Please review diverticulosis and high fiber diet instructions provided.

## 2014-07-16 NOTE — Progress Notes (Signed)
A/ox3, pleased with MAC, report to RN 

## 2014-07-17 ENCOUNTER — Telehealth: Payer: Self-pay | Admitting: *Deleted

## 2014-07-17 NOTE — Telephone Encounter (Signed)
  Follow up Call-  Call back number 07/16/2014  Post procedure Call Back phone  # [phone]  Permission to leave phone message Yes     Patient questions:  Do you have a fever, pain , or abdominal swelling? No. Pain Score  0 *  Have you tolerated food without any problems? Yes.    Have you been able to return to your normal activities? Yes.    Do you have any questions about your discharge instructions: Diet   No. Medications  No. Follow up visit  No.  Do you have questions or concerns about your Care? No.  Actions: * If pain score is 4 or above: No action needed, pain <4.

## 2015-09-18 ENCOUNTER — Other Ambulatory Visit: Payer: Self-pay | Admitting: *Deleted

## 2015-09-18 MED ORDER — HYDROCHLOROTHIAZIDE 25 MG PO TABS
25.0000 mg | ORAL_TABLET | Freq: Every day | ORAL | 0 refills | Status: DC
Start: 2015-09-18 — End: 2015-11-03

## 2015-09-18 NOTE — Telephone Encounter (Signed)
Pt left msgon triage yesterday afternoon stating she is needing a refill on her HCTZ currently is out. Has made an appt to see Tammy SoursGreg on 10/15/15. Sent 30 day to [facility]...Raechel Chute/lmb

## 2015-10-15 ENCOUNTER — Other Ambulatory Visit (INDEPENDENT_AMBULATORY_CARE_PROVIDER_SITE_OTHER): Payer: BLUE CROSS/BLUE SHIELD

## 2015-10-15 ENCOUNTER — Encounter: Payer: Self-pay | Admitting: Family

## 2015-10-15 ENCOUNTER — Ambulatory Visit (INDEPENDENT_AMBULATORY_CARE_PROVIDER_SITE_OTHER): Payer: BLUE CROSS/BLUE SHIELD | Admitting: Family

## 2015-10-15 VITALS — BP 168/120 | HR 81 | Temp 98.5°F | Resp 16 | Ht 67.5 in | Wt 272.0 lb

## 2015-10-15 DIAGNOSIS — Z23 Encounter for immunization: Secondary | ICD-10-CM | POA: Diagnosis not present

## 2015-10-15 DIAGNOSIS — Z0001 Encounter for general adult medical examination with abnormal findings: Secondary | ICD-10-CM

## 2015-10-15 DIAGNOSIS — R6889 Other general symptoms and signs: Secondary | ICD-10-CM

## 2015-10-15 DIAGNOSIS — I10 Essential (primary) hypertension: Secondary | ICD-10-CM

## 2015-10-15 DIAGNOSIS — F329 Major depressive disorder, single episode, unspecified: Secondary | ICD-10-CM | POA: Diagnosis not present

## 2015-10-15 DIAGNOSIS — Z0000 Encounter for general adult medical examination without abnormal findings: Secondary | ICD-10-CM | POA: Diagnosis not present

## 2015-10-15 DIAGNOSIS — F32A Depression, unspecified: Secondary | ICD-10-CM | POA: Insufficient documentation

## 2015-10-15 LAB — LIPID PANEL
CHOL/HDL RATIO: 4
Cholesterol: 204 mg/dL — ABNORMAL HIGH (ref 0–200)
HDL: 49.8 mg/dL (ref >39.00)
LDL CALC: 123 mg/dL — AB (ref 0–99)
NONHDL: 154.09
Triglycerides: 156 mg/dL — ABNORMAL HIGH (ref 0.0–149.0)
VLDL: 31.2 mg/dL (ref 0.0–40.0)

## 2015-10-15 LAB — COMPREHENSIVE METABOLIC PANEL
ALT: 30 U/L (ref 0–35)
AST: 20 U/L (ref 0–37)
Albumin: 4.1 g/dL (ref 3.5–5.2)
Alkaline Phosphatase: 68 U/L (ref 39–117)
BILIRUBIN TOTAL: 0.7 mg/dL (ref 0.2–1.2)
BUN: 9 mg/dL (ref 6–23)
CHLORIDE: 101 meq/L (ref 96–112)
CO2: 34 meq/L — AB (ref 19–32)
CREATININE: 0.73 mg/dL (ref 0.40–1.20)
Calcium: 9.1 mg/dL (ref 8.4–10.5)
GFR: 114.03 mL/min (ref >60.00)
GLUCOSE: 88 mg/dL (ref 70–99)
Potassium: 3.2 mEq/L — ABNORMAL LOW (ref 3.5–5.1)
SODIUM: 141 meq/L (ref 135–145)
Total Protein: 7.5 g/dL (ref 6.0–8.3)

## 2015-10-15 LAB — CBC
HCT: 37.9 % (ref 36.0–46.0)
Hemoglobin: 12.8 g/dL (ref 12.0–15.0)
MCHC: 33.7 g/dL (ref 30.0–36.0)
MCV: 74.1 fl — AB (ref 78.0–100.0)
PLATELETS: 378 10*3/uL (ref 150.0–400.0)
RBC: 5.12 Mil/uL — ABNORMAL HIGH (ref 3.87–5.11)
RDW: 15.6 % — ABNORMAL HIGH (ref 11.5–15.5)
WBC: 4.5 10*3/uL (ref 4.0–10.5)

## 2015-10-15 LAB — VITAMIN D 25 HYDROXY (VIT D DEFICIENCY, FRACTURES): VITD: 11.81 ng/mL — AB (ref 30.00–100.00)

## 2015-10-15 LAB — HEMOGLOBIN A1C: HEMOGLOBIN A1C: 5.7 % (ref 4.6–6.5)

## 2015-10-15 MED ORDER — SERTRALINE HCL 50 MG PO TABS: 50.0000 mg | ORAL_TABLET | Freq: Every day | ORAL | 0 refills | Status: DC

## 2015-10-15 MED ORDER — LISINOPRIL-HYDROCHLOROTHIAZIDE 20-25 MG PO TABS: 1.0000 | ORAL_TABLET | Freq: Every day | ORAL | 0 refills | Status: DC

## 2015-10-15 NOTE — Assessment & Plan Note (Signed)
Symptoms consistent with mild depression most likely from lifestyle stressors and potential job changes that [place] within the next 2 months to start fresh. Denies suicidal ideations. Start Zoloft. Discussed proper medication usage and side effects. Encouraged counseling if symptoms do not improve with medication. Follow-up in one month or sooner if needed.

## 2015-10-15 NOTE — Assessment & Plan Note (Signed)
BMI of 41.97 consistent with severe obesity.Recommend weight loss of 5-10% of current body weight. Recommend increasing physical activity to 30 minutes of moderate level activity daily. Encourage nutritional intake that focuses on nutrient dense foods and is moderate, varied, and balanced and is low in saturated fats and processed/sugary foods. Continue to monitor.

## 2015-10-15 NOTE — Patient Instructions (Addendum)
Thank you for choosing [facility].  SUMMARY AND INSTRUCTIONS:  Medication:  Your prescription(s) have been submitted to your pharmacy or been printed and provided for you. Please take as directed and contact our office if you believe you are having problem(s) with the medication(s) or have any questions.  Labs:  Please stop by the lab on the lower level of the building for your blood work. Your results will be released to [facility] (or called to you) after review, usually within 72 hours after test completion. If any changes need to be made, you will be notified at that same time.  1.) The lab is open from 7:30am to 5:30 pm Monday-Friday 2.) No appointment is necessary 3.) Fasting (if needed) is 6-8 hours after food and drink; black coffee and water are okay    Follow up:  If your symptoms worsen or fail to improve, please contact our office for further instruction, or in case of emergency go directly to the emergency room at the closest medical facility.    Health Maintenance, Female Adopting a healthy lifestyle and getting preventive care can go a long way to promote health and wellness. Talk with your health care provider about what schedule of regular examinations is right for you. This is a good chance for you to check in with your provider about disease prevention and staying healthy. In between checkups, there are plenty of things you can do on your own. Experts have done a lot of research about which lifestyle changes and preventive measures are most likely to keep you healthy. Ask your health care provider for more information. WEIGHT AND DIET  Eat a healthy diet  Be sure to include plenty of vegetables, fruits, low-fat dairy products, and lean protein.  Do not eat a lot of foods high in solid fats, added sugars, or salt.  Get regular exercise. This is one of the most important things you can do for your health.  Most adults should exercise for at least 150 minutes  each week. The exercise should increase your heart rate and make you sweat (moderate-intensity exercise).  Most adults should also do strengthening exercises at least twice a week. This is in addition to the moderate-intensity exercise.  Maintain a healthy weight  Body mass index (BMI) is a measurement that can be used to identify possible weight problems. It estimates body fat based on height and weight. Your health care provider can help determine your BMI and help you achieve or maintain a healthy weight.  For females 30 years of age and older:   A BMI below 18.5 is considered underweight.  A BMI of 18.5 to 24.9 is normal.  A BMI of 25 to 29.9 is considered overweight.  A BMI of 30 and above is considered obese.  Watch levels of cholesterol and blood lipids  You should start having your blood tested for lipids and cholesterol at 39 years of age, then have this test every 5 years.  You may need to have your cholesterol levels checked more often if:  Your lipid or cholesterol levels are high.  You are older than 39 years of age.  You are at high risk for heart disease.  CANCER SCREENING   Lung Cancer  Lung cancer screening is recommended for adults 62-44 years old who are at high risk for lung cancer because of a history of smoking.  A yearly low-dose CT scan of the lungs is recommended for people who:  Currently smoke.  Have quit  within the past 15 years.  Have at least a 30-pack-year history of smoking. A pack year is smoking an average of one pack of cigarettes a day for 1 year.  Yearly screening should continue until it has been 15 years since you quit.  Yearly screening should stop if you develop a health problem that would prevent you from having lung cancer treatment.  Breast Cancer  Practice breast self-awareness. This means understanding how your breasts normally appear and feel.  It also means doing regular breast self-exams. Let your health care  provider know about any changes, no matter how small.  If you are in your 20s or 30s, you should have a clinical breast exam (CBE) by a health care provider every 1-3 years as part of a regular health exam.  If you are 20 or older, have a CBE every year. Also consider having a breast X-ray (mammogram) every year.  If you have a family history of breast cancer, talk to your health care provider about genetic screening.  If you are at high risk for breast cancer, talk to your health care provider about having an MRI and a mammogram every year.  Breast cancer gene (BRCA) assessment is recommended for women who have family members with BRCA-related cancers. BRCA-related cancers include:  Breast.  Ovarian.  Tubal.  Peritoneal cancers.  Results of the assessment will determine the need for genetic counseling and BRCA1 and BRCA2 testing. Cervical Cancer Your health care provider may recommend that you be screened regularly for cancer of the pelvic organs (ovaries, uterus, and vagina). This screening involves a pelvic examination, including checking for microscopic changes to the surface of your cervix (Pap test). You may be encouraged to have this screening done every 3 years, beginning at age 12.  For women ages 25-65, health care providers may recommend pelvic exams and Pap testing every 3 years, or they may recommend the Pap and pelvic exam, combined with testing for human papilloma virus (HPV), every 5 years. Some types of HPV increase your risk of cervical cancer. Testing for HPV may also be done on women of any age with unclear Pap test results.  Other health care providers may not recommend any screening for nonpregnant women who are considered low risk for pelvic cancer and who do not have symptoms. Ask your health care provider if a screening pelvic exam is right for you.  If you have had past treatment for cervical cancer or a condition that could lead to cancer, you need Pap tests and  screening for cancer for at least 20 years after your treatment. If Pap tests have been discontinued, your risk factors (such as having a new sexual partner) need to be reassessed to determine if screening should resume. Some women have medical problems that increase the chance of getting cervical cancer. In these cases, your health care provider may recommend more frequent screening and Pap tests. Colorectal Cancer  This type of cancer can be detected and often prevented.  Routine colorectal cancer screening usually begins at 39 years of age and continues through 39 years of age.  Your health care provider may recommend screening at an earlier age if you have risk factors for colon cancer.  Your health care provider may also recommend using home test kits to check for hidden blood in the stool.  A small camera at the end of a tube can be used to examine your colon directly (sigmoidoscopy or colonoscopy). This is done to check for the  earliest forms of colorectal cancer.  Routine screening usually begins at age 53.  Direct examination of the colon should be repeated every 5-10 years through 39 years of age. However, you may need to be screened more often if early forms of precancerous polyps or small growths are found. Skin Cancer  Check your skin from head to toe regularly.  Tell your health care provider about any new moles or changes in moles, especially if there is a change in a mole's shape or color.  Also tell your health care provider if you have a mole that is larger than the size of a pencil eraser.  Always use sunscreen. Apply sunscreen liberally and repeatedly throughout the day.  Protect yourself by wearing long sleeves, pants, a wide-brimmed hat, and sunglasses whenever you are outside. HEART DISEASE, DIABETES, AND HIGH BLOOD PRESSURE   High blood pressure causes heart disease and increases the risk of stroke. High blood pressure is more likely to develop in:  People who  have blood pressure in the high end of the normal range (130-139/85-89 mm Hg).  People who are overweight or obese.  People who are African American.  If you are 60-75 years of age, have your blood pressure checked every 3-5 years. If you are 60 years of age or older, have your blood pressure checked every year. You should have your blood pressure measured twice--once when you are at a hospital or clinic, and once when you are not at a hospital or clinic. Record the average of the two measurements. To check your blood pressure when you are not at a hospital or clinic, you can use:  An automated blood pressure machine at a pharmacy.  A home blood pressure monitor.  If you are between 43 years and 56 years old, ask your health care provider if you should take aspirin to prevent strokes.  Have regular diabetes screenings. This involves taking a blood sample to check your fasting blood sugar level.  If you are at a normal weight and have a low risk for diabetes, have this test once every three years after 39 years of age.  If you are overweight and have a high risk for diabetes, consider being tested at a younger age or more often. PREVENTING INFECTION  Hepatitis B  If you have a higher risk for hepatitis B, you should be screened for this virus. You are considered at high risk for hepatitis B if:  You were born in a country where hepatitis B is common. Ask your health care provider which countries are considered high risk.  Your parents were born in a high-risk country, and you have not been immunized against hepatitis B (hepatitis B vaccine).  You have HIV or AIDS.  You use needles to inject street drugs.  You live with someone who has hepatitis B.  You have had sex with someone who has hepatitis B.  You get hemodialysis treatment.  You take certain medicines for conditions, including cancer, organ transplantation, and autoimmune conditions. Hepatitis C  Blood testing is  recommended for:  Everyone born from [birth year] through 1964.  Anyone with known risk factors for hepatitis C. Sexually transmitted infections (STIs)  You should be screened for sexually transmitted infections (STIs) including gonorrhea and chlamydia if:  You are sexually active and are younger than 39 years of age.  You are older than 39 years of age and your health care provider tells you that you are at risk for this type of infection.  Your sexual activity has changed since you were last screened and you are at an increased risk for chlamydia or gonorrhea. Ask your health care provider if you are at risk.  If you do not have HIV, but are at risk, it may be recommended that you take a prescription medicine daily to prevent HIV infection. This is called pre-exposure prophylaxis (PrEP). You are considered at risk if:  You are sexually active and do not regularly use condoms or know the HIV status of your partner(s).  You take drugs by injection.  You are sexually active with a partner who has HIV. Talk with your health care provider about whether you are at high risk of being infected with HIV. If you choose to begin PrEP, you should first be tested for HIV. You should then be tested every 3 months for as long as you are taking PrEP.  PREGNANCY   If you are premenopausal and you may become pregnant, ask your health care provider about preconception counseling.  If you may become pregnant, take 400 to 800 micrograms (mcg) of folic acid every day.  If you want to prevent pregnancy, talk to your health care provider about birth control (contraception). OSTEOPOROSIS AND MENOPAUSE   Osteoporosis is a disease in which the bones lose minerals and strength with aging. This can result in serious bone fractures. Your risk for osteoporosis can be identified using a bone density scan.  If you are 67 years of age or older, or if you are at risk for osteoporosis and fractures, ask your health care  provider if you should be screened.  Ask your health care provider whether you should take a calcium or vitamin D supplement to lower your risk for osteoporosis.  Menopause may have certain physical symptoms and risks.  Hormone replacement therapy may reduce some of these symptoms and risks. Talk to your health care provider about whether hormone replacement therapy is right for you.  HOME CARE INSTRUCTIONS   Schedule regular health, dental, and eye exams.  Stay current with your immunizations.   Do not use any tobacco products including cigarettes, chewing tobacco, or electronic cigarettes.  If you are pregnant, do not drink alcohol.  If you are breastfeeding, limit how much and how often you drink alcohol.  Limit alcohol intake to no more than 1 drink per day for nonpregnant women. One drink equals 12 ounces of beer, 5 ounces of wine, or 1 ounces of hard liquor.  Do not use street drugs.  Do not share needles.  Ask your health care provider for help if you need support or information about quitting drugs.  Tell your health care provider if you often feel depressed.  Tell your health care provider if you have ever been abused or do not feel safe at home.   This information is not intended to replace advice given to you by your health care provider. Make sure you discuss any questions you have with your health care provider.   Document Released: 08/09/2010 Document Revised: 02/14/2014 Document Reviewed: 12/26/2012 Elsevier Interactive Patient Education [employer].

## 2015-10-15 NOTE — Progress Notes (Signed)
Subjective:    Patient ID: Chloe [name], female    DOB: Jan 11, 1976, 40 y.o.   MRN: [id number]  Chief Complaint  Patient presents with  . CPE    HPI:  Chloe [name] is a 40 y.o. female who presents today for an annual wellness visit.   1) Health Maintenance -   Diet - Averaging about 2-3 meals per day consisting of a regular diet; Fast/processed foods about 3x per week; Caffeine intake of 4-5 cups every few days.   Exercise - 2x per week; dancing   2) Preventative Exams / Immunizations:  Dental -- Due for exam  Vision -- Up to date   Health Maintenance  Topic Date Due  . HIV Screening  08/08/1991  . TETANUS/TDAP  08/08/1995  . INFLUENZA VACCINE  09/08/2015  . PAP SMEAR  03/11/2016  . COLONOSCOPY  07/16/2019    Immunization History  Administered Date(s) Administered  . Influenza,inj,Quad PF,36+ Mos 10/15/2015    3.) Dysphoric mood - this is a new problem. Associated symptom of depression/dysphoric mood have been going on for approximately one to 2 months secondary to life stressors and changes in her current employment. Denies suicidal a radiation's. There are no modifying factors that make this better or worse. Has not been on medication in the past. Severity of symptoms is enough to affect her sleep which has been decreased.   Allergies  Allergen Reactions  . Tape     Paper tape "swollen and red"     Outpatient Medications Prior to Visit  Medication Sig Dispense Refill  . hydrochlorothiazide (HYDRODIURIL) 25 MG tablet Take 1 tablet (25 mg total) by mouth daily. Keep 9/7 appt for future refills 30 tablet 0  . metFORMIN (GLUCOPHAGE) 1000 MG tablet Take 1 tablet (1,000 mg total) by mouth 2 (two) times daily with a meal. 60 tablet 2   No facility-administered medications prior to visit.      Past Medical History:  Diagnosis Date  . Chicken pox   . Depression   . Diverticulitis    pt denies; has diverticulosis  . Heart murmur   . Hypertension    . Migraine   . PCOS (polycystic ovarian syndrome)   . Positive TB test      Past Surgical History:  Procedure Laterality Date  . COLONOSCOPY       Family History  Problem Relation Age of Onset  . Hyperlipidemia Mother   . Hypertension Mother   . Drug abuse Father   . Hyperlipidemia Father   . Stroke Father   . Hypertension Father   . Kidney disease Father   . Diabetes Father   . Hyperlipidemia Sister   . Hypertension Sister   . Colon cancer Sister   . Esophageal cancer Neg Hx   . Rectal cancer Neg Hx   . Stomach cancer Neg Hx      Social History   Social History  . Marital status: Divorced    Spouse name: N/A  . Number of children: 0  . Years of education: 14   Occupational History  . Eligibility and Enrollment Specialist    Social History Main Topics  . Smoking status: Former Smoker    Types: Cigarettes    Quit date: 08/25/2011  . Smokeless tobacco: Never Used  . Alcohol use Yes     Comment: occasionally  . Drug use: No  . Sexual activity: Not on file   Other Topics Concern  . Not on file  Social History Narrative   Fun: Outside activities, family activity, laser tag, dancing   Denies any religious beliefs effecting health care.    Denies abuse and feels safe at home.        Review of Systems  Constitutional: Denies fever, chills, fatigue, or significant weight gain/loss. HENT: Head: Denies headache or neck pain Ears: Denies changes in hearing, ringing in ears, earache, drainage Nose: Denies discharge, stuffiness, itching, nosebleed, sinus pain Throat: Denies sore throat, hoarseness, dry mouth, sores, thrush Eyes: Denies loss/changes in vision, pain, redness, blurry/double vision, flashing lights Cardiovascular: Denies chest pain/discomfort, tightness, palpitations, shortness of breath with activity, difficulty lying down, swelling, sudden awakening with shortness of breath Respiratory: Denies shortness of breath, cough, sputum production,  wheezing Gastrointestinal: Denies dysphasia, heartburn, change in appetite, nausea, change in bowel habits, rectal bleeding, constipation, diarrhea, yellow skin or eyes Genitourinary: Denies frequency, urgency, burning/pain, blood in urine, incontinence, change in urinary strength. Musculoskeletal: Denies muscle/joint pain, stiffness, back pain, redness or swelling of joints, trauma Skin: Denies rashes, lumps, itching, dryness, color changes, or hair/nail changes Neurological: Denies dizziness, fainting, seizures, weakness, numbness, tingling, tremor Psychiatric - Denies nervousness, stress, depression or memory loss Positive for depression, denies suicidal ideations Endocrine: Denies heat or cold intolerance, sweating, frequent urination, excessive thirst, changes in appetite Hematologic: Denies ease of bruising or bleeding     Objective:    BP (!) 168/120 (BP Location: Left Arm, Patient Position: Sitting, Cuff Size: Large)   Pulse 81   Temp 98.5 F (36.9 C) (Oral)   Resp 16   Ht 5' 7.5" (1.715 m)   Wt 272 lb (123.4 kg)   SpO2 98%   BMI 41.97 kg/m  Nursing note and vital signs reviewed.   Depression screen PHQ 2/9 10/15/2015  Decreased Interest 1  Down, Depressed, Hopeless 1  PHQ - 2 Score 2  Altered sleeping 3  Tired, decreased energy 1  Change in appetite 1  Feeling bad or failure about yourself  0  Trouble concentrating 1  Moving slowly or fidgety/restless 0  Suicidal thoughts 0  PHQ-9 Score 8  Difficult doing work/chores Not difficult at all    Physical Exam  Constitutional: She is oriented to person, place, and time. She appears well-developed and well-nourished.  HENT:  Head: Normocephalic.  Right Ear: Hearing, tympanic membrane, external ear and ear canal normal.  Left Ear: Hearing, tympanic membrane, external ear and ear canal normal.  Nose: Nose normal.  Mouth/Throat: Uvula is midline, oropharynx is clear and moist and mucous membranes are normal.  Eyes:  Conjunctivae and EOM are normal. Pupils are equal, round, and reactive to light.  Neck: Neck supple. No JVD present. No tracheal deviation present. No thyromegaly present.  acanthosis nigrans present.  Cardiovascular: Normal rate, regular rhythm, normal heart sounds and intact distal pulses.   Pulmonary/Chest: Effort normal and breath sounds normal.  Abdominal: Soft. Bowel sounds are normal. She exhibits no distension and no mass. There is no tenderness. There is no rebound and no guarding.  Musculoskeletal: Normal range of motion. She exhibits no edema or tenderness.  Lymphadenopathy:    She has no cervical adenopathy.  Neurological: She is alert and oriented to person, place, and time. She has normal reflexes. No cranial nerve deficit. She exhibits normal muscle tone. Coordination normal.  Skin: Skin is warm and dry.  Psychiatric: Her behavior is normal. Judgment and thought content normal. Her mood appears anxious. She exhibits a depressed mood.       Assessment &  Plan:   Problem List Items Addressed This Visit      Cardiovascular and Mediastinum   Essential hypertension    Hypertension remains uncontrolled and above goal 140/90 with current regimen. Denies worse headache of life with no symptoms of end organ damage present upon exam. Encouraged to follow a low-sodium diet and decrease weight to help with blood pressure management. Discontinue hydrochlorothiazide. Start lisinopril-hydrochlorothiazide. Monitor blood pressure at home.      Relevant Medications   lisinopril-hydrochlorothiazide (PRINZIDE,ZESTORETIC) 20-25 MG tablet     Other   Depression - Primary    Symptoms consistent with mild depression most likely from lifestyle stressors and potential job changes that she is moving to [place] within the next 2 months to start fresh. Denies suicidal ideations. Start Zoloft. Discussed proper medication usage and side effects. Encouraged counseling if symptoms do not improve with  medication. Follow-up in one month or sooner if needed.      Relevant Medications   sertraline (ZOLOFT) 50 MG tablet   Encounter for routine adult medical exam with abnormal findings    1) Anticipatory Guidance: Discussed importance of wearing a seatbelt while driving and not texting while driving; changing batteries in smoke detector at least once annually; wearing suntan lotion when outside; eating a balanced and moderate diet; getting physical activity at least 30 minutes per day.  2) Immunizations / Screenings / Labs:  Tetanus and influenza updated today. All other immunizations are up-to-date per recommendations. Obtain A1c for diabetes screening. Obtain vitamin D for vitamin D deficiency screening. Obtain HIV for HIV screening. Due for a dental exam encouraged to be completed independently. All other screenings are up-to-date per recommendations. Obtain CBC, CMET, and lipid profile.    Overall well exam with risk factors for cardiovascular disease including obesity and hypertension.Recommend weight loss of 5-10% of current body weight. Recommend increasing physical activity to 30 minutes of moderate level activity daily. Encourage nutritional intake that focuses on nutrient dense foods and is moderate, varied, and balanced and is low in saturated fats and processed/sugary foods. Blood pressure remains uncontrolled with change in medication. Encouraged to continue current healthy lifestyle choices and behaviors. Follow-up prevention exam in 1 year. Follow-up office visit in one month for chronic conditions.       Relevant Orders   CBC   Comprehensive metabolic panel   Lipid panel   VITAMIN D 25 Hydroxy (Vit-D Deficiency, Fractures)   HIV antibody   Hemoglobin A1c   Morbid obesity (HCC)    BMI of 41.97 consistent with severe obesity.Recommend weight loss of 5-10% of current body weight. Recommend increasing physical activity to 30 minutes of moderate level activity daily. Encourage  nutritional intake that focuses on nutrient dense foods and is moderate, varied, and balanced and is low in saturated fats and processed/sugary foods. Continue to monitor.         Other Visit Diagnoses   None.      I have discontinued Ms. Mauceri's metFORMIN. I am also having her start on sertraline and lisinopril-hydrochlorothiazide. Additionally, I am having her maintain her hydrochlorothiazide.   Meds ordered this encounter  Medications  . sertraline (ZOLOFT) 50 MG tablet    Sig: Take 1 tablet (50 mg total) by mouth daily.    Dispense:  30 tablet    Refill:  0    Order Specific Question:   Supervising Provider    Answer:   Hillard Danker A [[id number]]  . lisinopril-hydrochlorothiazide (PRINZIDE,ZESTORETIC) 20-25 MG tablet    Sig: Take  1 tablet by mouth daily.    Dispense:  30 tablet    Refill:  0    Order Specific Question:   Supervising Provider    Answer:   Hillard Danker A [[id number]]     Follow-up: Return in about 1 month (around 11/14/2015), or if symptoms worsen or fail to improve.   Jeanine Luz, FNP

## 2015-10-15 NOTE — Assessment & Plan Note (Signed)
Hypertension remains uncontrolled and above goal 140/90 with current regimen. Denies worse headache of life with no symptoms of end organ damage present upon exam. Encouraged to follow a low-sodium diet and decrease weight to help with blood pressure management. Discontinue hydrochlorothiazide. Start lisinopril-hydrochlorothiazide. Monitor blood pressure at home.

## 2015-10-15 NOTE — Assessment & Plan Note (Signed)
1) Anticipatory Guidance: Discussed importance of wearing a seatbelt while driving and not texting while driving; changing batteries in smoke detector at least once annually; wearing suntan lotion when outside; eating a balanced and moderate diet; getting physical activity at least 30 minutes per day.  2) Immunizations / Screenings / Labs:  Tetanus and influenza updated today. All other immunizations are up-to-date per recommendations. Obtain A1c for diabetes screening. Obtain vitamin D for vitamin D deficiency screening. Obtain HIV for HIV screening. Due for a dental exam encouraged to be completed independently. All other screenings are up-to-date per recommendations. Obtain CBC, CMET, and lipid profile.    Overall well exam with risk factors for cardiovascular disease including obesity and hypertension.Recommend weight loss of 5-10% of current body weight. Recommend increasing physical activity to 30 minutes of moderate level activity daily. Encourage nutritional intake that focuses on nutrient dense foods and is moderate, varied, and balanced and is low in saturated fats and processed/sugary foods. Blood pressure remains uncontrolled with change in medication. Encouraged to continue current healthy lifestyle choices and behaviors. Follow-up prevention exam in 1 year. Follow-up office visit in one month for chronic conditions.

## 2015-10-16 ENCOUNTER — Encounter: Payer: Self-pay | Admitting: Family

## 2015-10-16 ENCOUNTER — Other Ambulatory Visit: Payer: Self-pay | Admitting: Family

## 2015-10-16 DIAGNOSIS — E559 Vitamin D deficiency, unspecified: Secondary | ICD-10-CM

## 2015-10-16 LAB — HIV ANTIBODY (ROUTINE TESTING W REFLEX): HIV 1&2 Ab, 4th Generation: NONREACTIVE

## 2015-10-16 MED ORDER — VITAMIN D (ERGOCALCIFEROL) 1.25 MG (50000 UNIT) PO CAPS: 50000.0000 [IU] | ORAL_CAPSULE | ORAL | 0 refills | Status: AC

## 2015-11-02 ENCOUNTER — Ambulatory Visit: Payer: BLUE CROSS/BLUE SHIELD | Admitting: Family

## 2015-11-03 ENCOUNTER — Ambulatory Visit (INDEPENDENT_AMBULATORY_CARE_PROVIDER_SITE_OTHER): Payer: BLUE CROSS/BLUE SHIELD | Admitting: Family

## 2015-11-03 ENCOUNTER — Encounter: Payer: Self-pay | Admitting: Family

## 2015-11-03 VITALS — BP 164/110 | HR 71 | Temp 98.4°F | Resp 18 | Ht 67.5 in | Wt 262.0 lb

## 2015-11-03 DIAGNOSIS — I10 Essential (primary) hypertension: Secondary | ICD-10-CM

## 2015-11-03 MED ORDER — AMLODIPINE BESYLATE 10 MG PO TABS: 10.0000 mg | ORAL_TABLET | Freq: Every day | ORAL | 0 refills | Status: AC

## 2015-11-03 MED ORDER — SERTRALINE HCL 50 MG PO TABS: 50.0000 mg | ORAL_TABLET | Freq: Every day | ORAL | 0 refills | Status: AC

## 2015-11-03 MED ORDER — LISINOPRIL-HYDROCHLOROTHIAZIDE 20-12.5 MG PO TABS: 2.0000 | ORAL_TABLET | Freq: Every day | ORAL | 0 refills | Status: AC

## 2015-11-03 NOTE — Progress Notes (Signed)
Subjective:    Patient ID: Chloe [name], female    DOB: 05/10/1975, 40 y.o.   MRN: [id number]  Chief Complaint  Patient presents with  . Follow-up    3 month supply of medications, Follow up on BP medications which seem to be working well per pt    HPI:  Chloe FlakeChandra Y Kersten is a 40 y.o. female who  has a past medical history of Chicken pox; Depression; Diverticulitis; Heart murmur; Hypertension; Migraine; PCOS (polycystic ovarian syndrome); and Positive TB test. and presents today for a follow up office visit.   1.) Hypertension - currently maintained on lisinopril-hydrochlorothiazide. Reports taking the medication as prescribed and denies adverse side effects. Does not currently check her blood pressures at home. Denies worse headache of life or symptoms of end organ damage. Working on following a low-sodium diet. She has lost 10 pounds since previous office visit.  BP Readings from Last 3 Encounters:  11/03/15 (!) 164/110  10/15/15 (!) 168/120  07/16/14 137/90     Allergies  Allergen Reactions  . Tape     Paper tape "swollen and red"     Outpatient Medications Prior to Visit  Medication Sig Dispense Refill  . Vitamin D, Ergocalciferol, (DRISDOL) 50000 units CAPS capsule Take 1 capsule (50,000 Units total) by mouth every 7 (seven) days. 8 capsule 0  . lisinopril-hydrochlorothiazide (PRINZIDE,ZESTORETIC) 20-25 MG tablet Take 1 tablet by mouth daily. 30 tablet 0  . sertraline (ZOLOFT) 50 MG tablet Take 1 tablet (50 mg total) by mouth daily. 30 tablet 0  . hydrochlorothiazide (HYDRODIURIL) 25 MG tablet Take 1 tablet (25 mg total) by mouth daily. Keep 9/7 appt for future refills (Patient not taking: Reported on 11/03/2015) 30 tablet 0   No facility-administered medications prior to visit.     Review of Systems  Constitutional: Negative for chills and fever.  Eyes:       Negative for changes in vision  Respiratory: Negative for cough, chest tightness and wheezing.     Cardiovascular: Negative for chest pain, palpitations and leg swelling.  Neurological: Negative for dizziness, weakness and light-headedness.      Objective:    BP (!) 164/110 (BP Location: Left Arm, Patient Position: Sitting)   Pulse 71   Temp 98.4 F (36.9 C) (Oral)   Resp 18   Ht 5' 7.5" (1.715 m)   Wt 262 lb (118.8 kg)   SpO2 97%   BMI 40.43 kg/m  Nursing note and vital signs reviewed.  Physical Exam  Constitutional: She is oriented to person, place, and time. She appears well-developed and well-nourished. No distress.  Cardiovascular: Normal rate, regular rhythm, normal heart sounds and intact distal pulses.   Pulmonary/Chest: Effort normal and breath sounds normal.  Neurological: She is alert and oriented to person, place, and time.  Skin: Skin is warm and dry.  Psychiatric: She has a normal mood and affect. Her behavior is normal. Judgment and thought content normal.       Assessment & Plan:   Problem List Items Addressed This Visit      Cardiovascular and Mediastinum   Essential hypertension - Primary    Blood pressure remains above goal of 140/90 with current regimen. Change dosage of lisinopril-hydrochlorothiazide and start amlodipine. Continue to monitor blood pressure at home and follow low-sodium diet. No evidence of worse headache of life for her symptoms of end organ damage presently.      Relevant Medications   amLODipine (NORVASC) 10 MG tablet   lisinopril-hydrochlorothiazide (  PRINZIDE,ZESTORETIC) 20-12.5 MG tablet    Other Visit Diagnoses   None.      I have discontinued Ms. Mullany's hydrochlorothiazide and lisinopril-hydrochlorothiazide. I am also having her start on amLODipine and lisinopril-hydrochlorothiazide. Additionally, I am having her maintain her Vitamin D (Ergocalciferol) and sertraline.   Meds ordered this encounter  Medications  . amLODipine (NORVASC) 10 MG tablet    Sig: Take 1 tablet (10 mg total) by mouth daily.    Dispense:   90 tablet    Refill:  0    Order Specific Question:   Supervising Provider    Answer:   Hillard Danker A [[id number]]  . lisinopril-hydrochlorothiazide (PRINZIDE,ZESTORETIC) 20-12.5 MG tablet    Sig: Take 2 tablets by mouth daily.    Dispense:  180 tablet    Refill:  0    Order Specific Question:   Supervising Provider    Answer:   Hillard Danker A [[id number]]  . sertraline (ZOLOFT) 50 MG tablet    Sig: Take 1 tablet (50 mg total) by mouth daily.    Dispense:  90 tablet    Refill:  0    Order Specific Question:   Supervising Provider    Answer:   Hillard Danker A [[id number]]     Follow-up: Return in about 1 month (around 12/03/2015), or if symptoms worsen or fail to improve.  Jeanine Luz, FNP

## 2015-11-03 NOTE — Patient Instructions (Signed)
Thank you for choosing [facility].  SUMMARY AND INSTRUCTIONS:  Medication:  Please continue to take your medications as prescribed.   Continue to monitor blood pressure.  Continue to follow low sodium diet.   Your prescription(s) have been submitted to your pharmacy or been printed and provided for you. Please take as directed and contact our office if you believe you are having problem(s) with the medication(s) or have any questions.   Follow up:  If your symptoms worsen or fail to improve, please contact our office for further instruction, or in case of emergency go directly to the emergency room at the closest medical facility.

## 2015-11-03 NOTE — Assessment & Plan Note (Signed)
Blood pressure remains above goal of 140/90 with current regimen. Change dosage of lisinopril-hydrochlorothiazide and start amlodipine. Continue to monitor blood pressure at home and follow low-sodium diet. No evidence of worse headache of life for her symptoms of end organ damage presently.

## 2019-05-30 ENCOUNTER — Encounter: Payer: Self-pay | Admitting: Gastroenterology
